# Patient Record
Sex: Male | Born: 1990 | Race: White | Hispanic: No | Marital: Married
Health system: Midwestern US, Academic
[De-identification: ages and names within clinical notes are randomized; demographics above are authoritative.]

---

## 2017-07-30 ENCOUNTER — Encounter: Admit: 2017-07-30 | Discharge: 2017-07-30 | Payer: BC Managed Care – PPO

## 2017-07-30 ENCOUNTER — Emergency Department: Admit: 2017-07-30 | Discharge: 2017-07-30 | Payer: BC Managed Care – PPO

## 2017-07-30 MED ORDER — FLUORESCEIN-PROPARACAINE OP DROP
1 [drp] | Freq: Once | OPHTHALMIC | 0 refills | Status: DC
Start: 2017-07-30 — End: 2017-07-31

## 2017-07-31 ENCOUNTER — Encounter: Admit: 2017-07-31 | Discharge: 2017-07-31 | Payer: BC Managed Care – PPO

## 2017-07-31 ENCOUNTER — Emergency Department: Admit: 2017-07-31 | Discharge: 2017-07-31 | Payer: BC Managed Care – PPO

## 2017-07-31 ENCOUNTER — Emergency Department
Admit: 2017-07-31 | Discharge: 2017-07-31 | Disposition: A | Discharge status: Home / Self Care | Payer: Worker's Compensation

## 2017-07-31 DIAGNOSIS — S0501XA Injury of conjunctiva and corneal abrasion without foreign body, right eye, initial encounter: Principal | ICD-10-CM

## 2017-07-31 MED ORDER — OXYCODONE 5 MG PO TAB
5 mg | Freq: Once | ORAL | 0 refills | Status: CP
Start: 2017-07-31 — End: ?
  Administered 2017-07-31: 08:00:00 5 mg via ORAL

## 2017-07-31 MED ORDER — FENTANYL CITRATE (PF) 50 MCG/ML IJ SOLN
50 ug | Freq: Once | INTRAVENOUS | 0 refills | Status: DC
Start: 2017-07-31 — End: 2017-07-31

## 2017-07-31 MED ORDER — CYCLOPENTOLATE 1 % OP DROP
1 [drp] | Freq: Three times a day (TID) | OPHTHALMIC | 0 refills | 31.00000 days | Status: AC
Start: 2017-07-31 — End: 2017-09-28

## 2017-07-31 MED ORDER — TOBRAMYCIN 0.3 % OP DROP
1 [drp] | Freq: Four times a day (QID) | OPHTHALMIC | 0 refills | 10.00000 days | Status: AC
Start: 2017-07-31 — End: 2017-09-28

## 2017-07-31 NOTE — ED Notes
Received Medical records from [facility]

## 2017-07-31 NOTE — ED Notes
Discharge instructions reviewed with patient and patient verbalized understanding.  Patient educated on corneal abrasions.  Patient leaving in agreement with plan of care.  Patient ambulated to exit with steady gait accompanied by family members x2.

## 2017-08-01 ENCOUNTER — Encounter: Admit: 2017-08-01 | Discharge: 2017-08-01 | Payer: BC Managed Care – PPO

## 2017-08-01 ENCOUNTER — Ambulatory Visit: Admit: 2017-08-01 | Discharge: 2017-08-02 | Payer: BC Managed Care – PPO

## 2017-08-01 DIAGNOSIS — I10 Essential (primary) hypertension: Principal | ICD-10-CM

## 2017-08-01 DIAGNOSIS — H18899 Other specified disorders of cornea, unspecified eye: Principal | ICD-10-CM

## 2017-08-01 NOTE — Assessment & Plan Note
Would expect corneal defect to be healed by now  Still with decrease in vision and pain  Will place BCL and start Vigamox QID along with ATs  F/U 2 days  Discussed importance of using drops and risk of infection  If no improvement in 2 days will consider Prokera

## 2017-08-01 NOTE — Progress Notes
Body mass index is 43.07 kg/m.              Assessment and Plan:    Problem   Persistent Corneal Epithelial Defect    hx of metal hitting OD with CTL in         Persistent corneal epithelial defect  Would expect corneal defect to be healed by now  Still with decrease in vision and pain  Will place BCL and start Vigamox QID along with ATs  F/U 2 days  Discussed importance of using drops and risk of infection  If no improvement in 2 days will consider Prokera    [facility] next appt    Donata Clay, MD  Staff Ophthalmologist

## 2017-08-03 ENCOUNTER — Ambulatory Visit: Admit: 2017-08-03 | Discharge: 2017-08-04 | Payer: Worker's Compensation

## 2017-08-03 ENCOUNTER — Encounter: Admit: 2017-08-03 | Discharge: 2017-08-03 | Payer: BC Managed Care – PPO

## 2017-08-03 DIAGNOSIS — H18899 Other specified disorders of cornea, unspecified eye: Principal | ICD-10-CM

## 2017-08-03 DIAGNOSIS — I10 Essential (primary) hypertension: Principal | ICD-10-CM

## 2017-08-03 NOTE — Progress Notes
There is no height or weight on file to calculate BMI.              Assessment and Plan:    Problem   Persistent Corneal Epithelial Defect    hx of metal hitting OD with CTL in         Persistent corneal epithelial defect  Removed BCL, epi defect appears improved and healing  Continue Vigamox QID and ATs QID  F/u in 1 week or sooner if worsening pain    Persistent corneal epithelial defect, healing  Defect now greatly improved, some irregular staining from re-epithelization  Pain and vision improving  Will d/c BCL and Vigamox, start erythromycin ointment TID with AT for comforts  Discussed importance of protecting eyes when at work, and using ointment   F/u in 1 week      Wonda Amis, MD  Ophthalmology Resident PGY-2  [phone]    ATTESTATION    I personally performed the key portions of the E/M visit, discussed case with resident and concur with resident documentation of history, physical exam, assessment, and treatment plan unless otherwise noted.    Staff name:  Gwendolyn Grant, MD Date:  08/03/2017        Next Visit:1 week  AUTO-Rx    MR    Atlas     Orbscan    LG  x   Schirmer     TOSM    IOP icare   Pachymetry    HVF    DILATE     OCT     M-Hdlebrg  N- Ziess    BAT    Gonio    IOL-Master        Donata Clay, MD  Staff Ophthalmologist

## 2017-08-10 ENCOUNTER — Encounter: Admit: 2017-08-10 | Discharge: 2017-08-10 | Payer: BC Managed Care – PPO

## 2017-08-10 DIAGNOSIS — H18899 Other specified disorders of cornea, unspecified eye: Principal | ICD-10-CM

## 2017-08-10 DIAGNOSIS — I10 Essential (primary) hypertension: Principal | ICD-10-CM

## 2017-08-10 NOTE — Progress Notes
Body mass index is 43.07 kg/m.              Assessment and Plan:    Problem   Persistent Corneal Epithelial Defect    hx of metal hitting OD with CTL in         Persistent corneal epithelial defect  Can stop Vigamox  Continue ATs QID-blurry vision likely from dryness  OCT mac normal  F/U with optometrist in [place] in 1 month  Can resume CTL next week if still comfortable  Discussed good CTL hygiene      Pauline Good, MD  Staff Ophthalmologist

## 2017-09-05 ENCOUNTER — Encounter: Admit: 2017-09-05 | Discharge: 2017-09-05 | Payer: BC Managed Care – PPO

## 2017-09-05 DIAGNOSIS — H18899 Other specified disorders of cornea, unspecified eye: Principal | ICD-10-CM

## 2017-09-05 DIAGNOSIS — I10 Essential (primary) hypertension: Principal | ICD-10-CM

## 2017-09-05 NOTE — Assessment & Plan Note
Vision continues to improve  Dryness improved but still present  Continue ATs BID-QID  Discussed good CTL hygiene and wearing safety glasses  F/U PRN

## 2017-09-05 NOTE — Progress Notes
Body mass index is 43.07 kg/m.              Assessment and Plan:    Problem   Persistent Corneal Epithelial Defect    hx of metal hitting OD with CTL in         Persistent corneal epithelial defect  Vision continues to improve  Dryness improved but still present  Continue ATs BID-QID  Discussed good CTL hygiene and wearing safety glasses  F/U PRN    Donata Clay, MD  Staff Ophthalmologist

## 2017-09-19 ENCOUNTER — Encounter: Admit: 2017-09-19 | Discharge: 2017-09-19 | Payer: BC Managed Care – PPO

## 2017-09-19 ENCOUNTER — Ambulatory Visit: Admit: 2017-09-19 | Discharge: 2017-09-20 | Payer: Worker's Compensation

## 2017-09-19 DIAGNOSIS — I10 Essential (primary) hypertension: Principal | ICD-10-CM

## 2017-09-19 DIAGNOSIS — H18899 Other specified disorders of cornea, unspecified eye: Principal | ICD-10-CM

## 2017-09-19 NOTE — Progress Notes
Body mass index is 44.39 kg/m.              Assessment and Plan:    Problem   Persistent Corneal Epithelial Defect    hx of metal hitting OD with CTL in         Persistent corneal epithelial defect  New epi defect centrally  Patient thinks maybe rubbed eye? Got dust in it?  BCL placed and started on Vigamox QID  Will f/u on Wed     Next Visit:wed  AUTO-Rx    MR    Atlas     Orbscan    LG  x   Schirmer     TOSM    IOP    Pachymetry    HVF    DILATE     OCT     M-Hdlebrg  N- Ziess    BAT    Gonio    IOL-Master        Donata Clay, MD  Staff Ophthalmologist

## 2017-09-19 NOTE — Assessment & Plan Note
New epi defect centrally  Patient thinks maybe rubbed eye? Got dust in it?  BCL placed and started on Vigamox QID  Will f/u on Wed

## 2017-09-21 ENCOUNTER — Ambulatory Visit: Admit: 2017-09-21 | Discharge: 2017-09-22 | Payer: Worker's Compensation

## 2017-09-21 ENCOUNTER — Encounter: Admit: 2017-09-21 | Discharge: 2017-09-21 | Payer: BC Managed Care – PPO

## 2017-09-21 DIAGNOSIS — I10 Essential (primary) hypertension: Principal | ICD-10-CM

## 2017-09-21 DIAGNOSIS — H18899 Other specified disorders of cornea, unspecified eye: Principal | ICD-10-CM

## 2017-09-21 MED ORDER — PREDNISOLONE ACETATE 1 % OP DRPS
1 [drp] | Freq: Four times a day (QID) | OPHTHALMIC | 1 refills | 5.00000 days | Status: AC
Start: 2017-09-21 — End: ?

## 2017-09-21 NOTE — Assessment & Plan Note
Epi defect resolved, now just PEE  Recommend continue Vigamox  QID and start PF QID  F/U 1 week  May need to stay out of contacts longer this time

## 2017-09-21 NOTE — Progress Notes
There is no height or weight on file to calculate BMI.              Assessment and Plan:    Problem   Persistent Corneal Epithelial Defect    hx of metal hitting OD with CTL in         Persistent corneal epithelial defect  Epi defect resolved, now just PEE  Recommend continue Vigamox  QID and start PF QID  F/U 1 week  May need to stay out of contacts longer this time       Next Visit:1 week  AUTO-Rx    MR    Atlas     Orbscan    LG  x   Schirmer     TOSM    IOP icare   Pachymetry    HVF    DILATE     OCT     M-Hdlebrg  N- Ziess    BAT    Gonio    IOL-Master        Donata Clay, MD  Staff Ophthalmologist

## 2017-09-28 ENCOUNTER — Encounter: Admit: 2017-09-28 | Discharge: 2017-09-28 | Payer: BC Managed Care – PPO

## 2017-09-28 DIAGNOSIS — H18899 Other specified disorders of cornea, unspecified eye: Principal | ICD-10-CM

## 2017-09-28 DIAGNOSIS — I10 Essential (primary) hypertension: Principal | ICD-10-CM

## 2017-09-28 NOTE — Progress Notes
There is no height or weight on file to calculate BMI.              Assessment and Plan:    Problem   Persistent Corneal Epithelial Defect    hx of metal hitting OD with CTL in         Persistent corneal epithelial defect  Now completely healed, no corneal edema  Discussed he may get recurrent epithelial defects so needs to keep eye lubricated  May consider adding Muro if gets another epithelial defect  Would like him to see optometry to make sure CTL is fitting properly  They will meet with an optometrist in [place]   Hold off on putting CTL until he is evaluated by optometrist    Donata Clay, MD  Staff Ophthalmologist

## 2017-09-28 NOTE — Assessment & Plan Note
Now completely healed, no corneal edema  Discussed he may get recurrent epithelial defects so needs to keep eye lubricated  May consider adding Muro if gets another epithelial defect  Would like him to see optometry to make sure CTL is fitting properly  They will meet with an optometrist in [place]   Hold off on putting CTL until he is evaluated by optometrist

## 2018-01-10 IMAGING — US DUPRENAL
1 series · 14 of 25 positions shown · non-contrast
Comparison: none

[Series 1: us duplex renal artery · 14 of 48 slices shown]
[im 1/48]
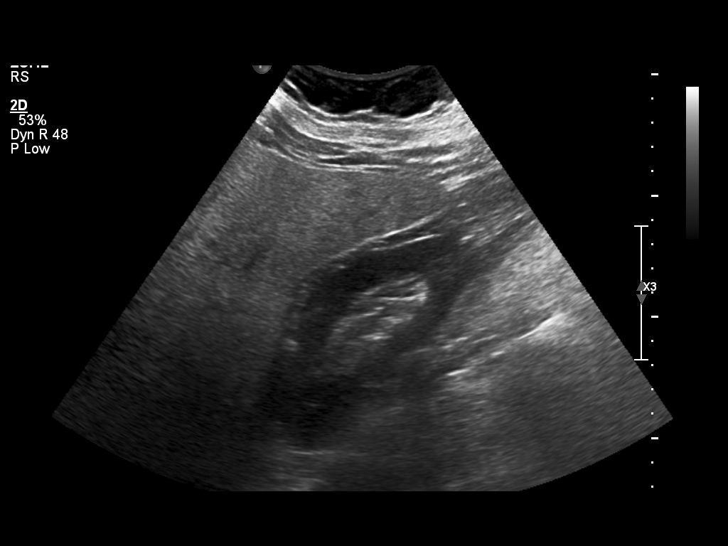
[im 4/48]
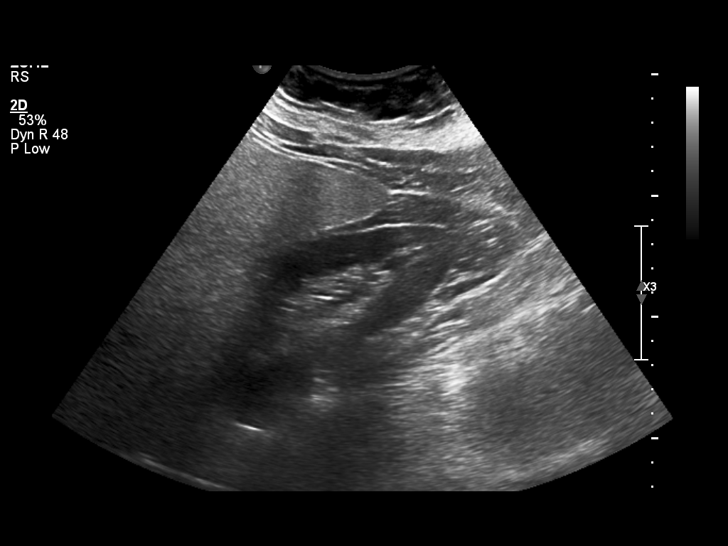
[im 8/48]
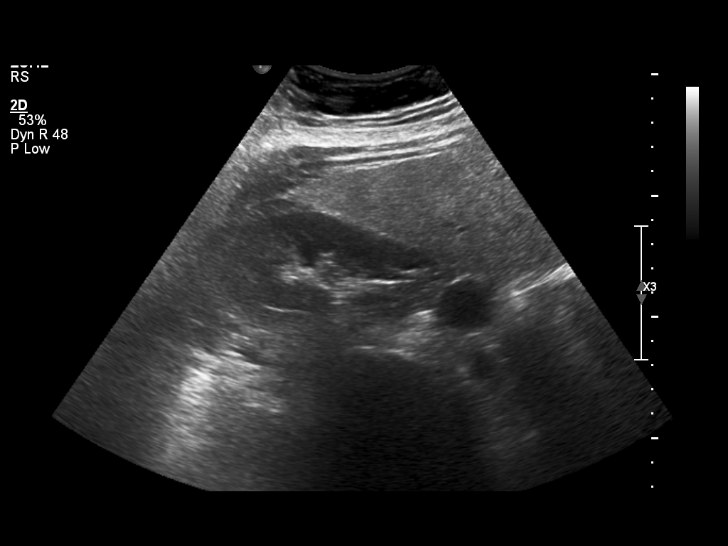
[im 12/48]
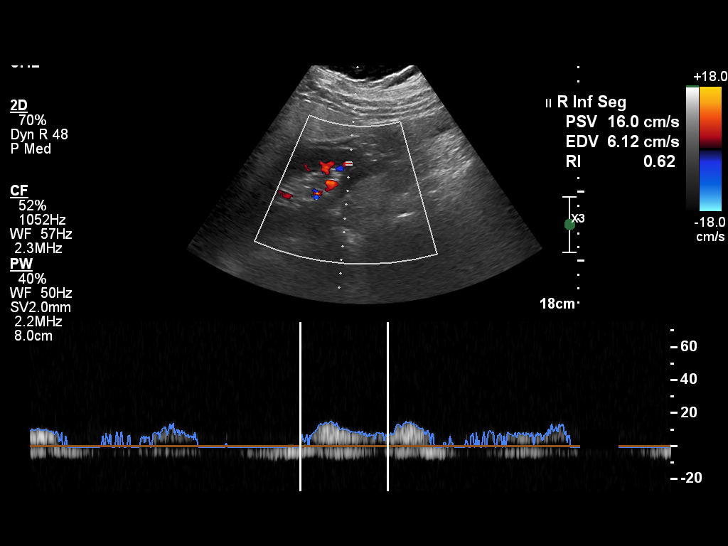
[im 16/48]
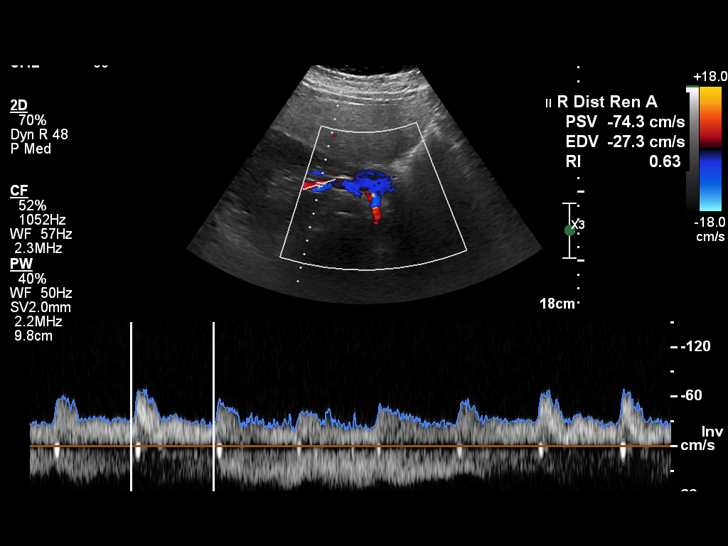
[im 18/48]
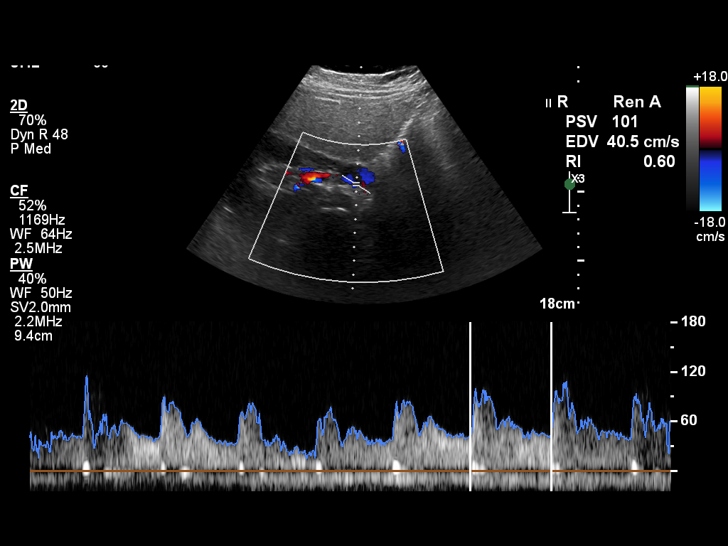
[im 22/48]
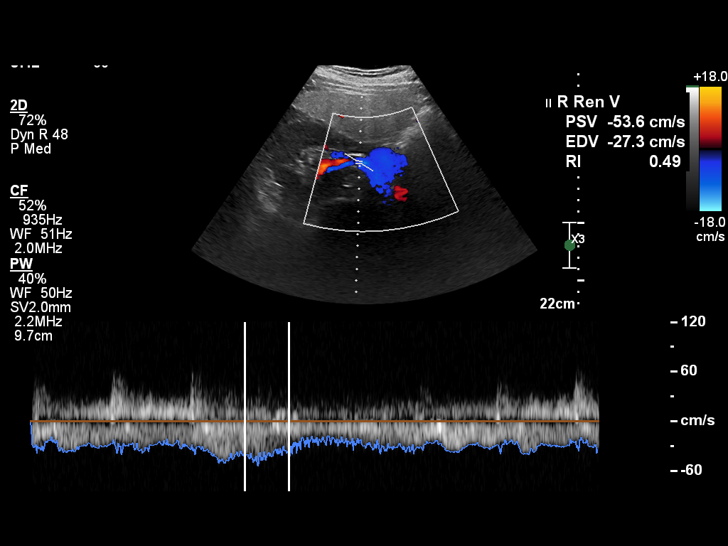
[im 26/48]
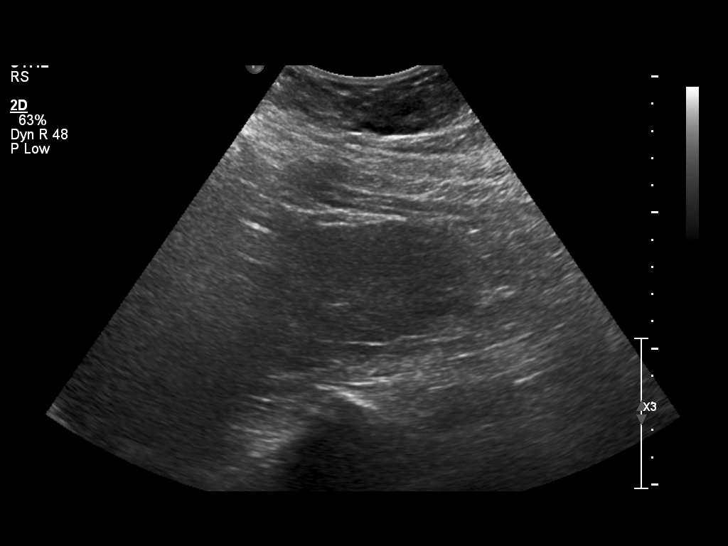
[im 30/48]
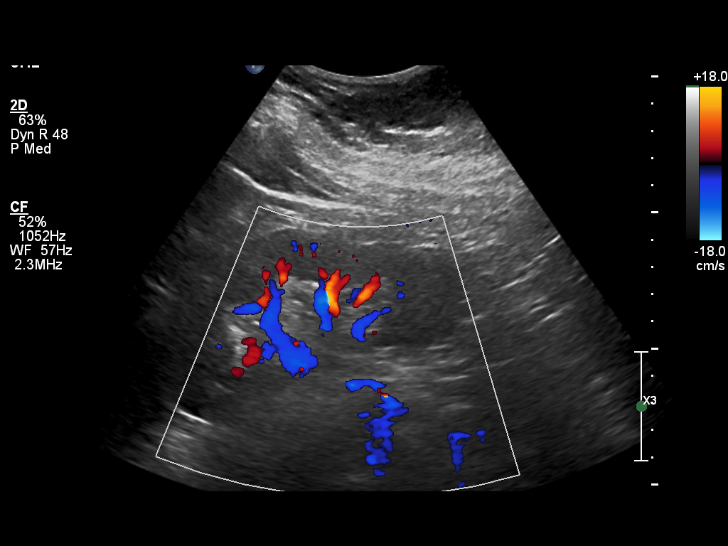
[im 32/48]
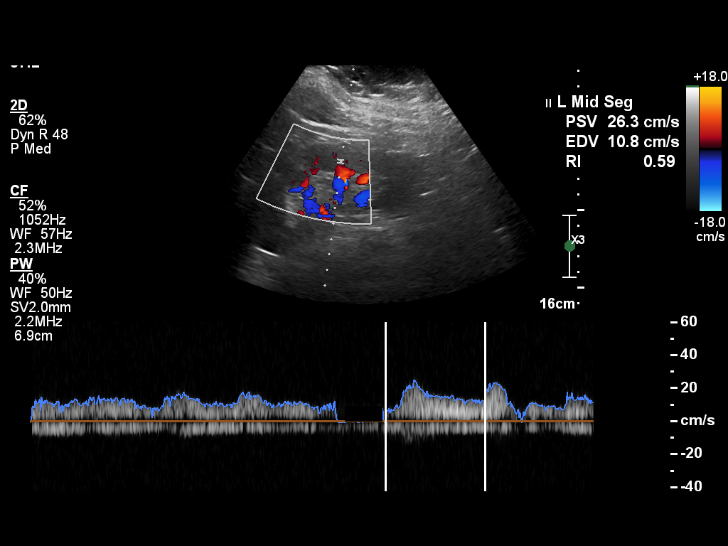
[im 36/48]
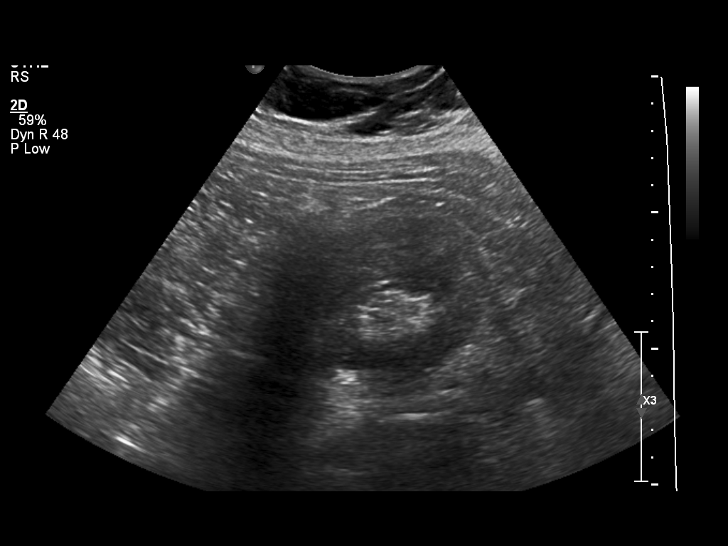
[im 40/48]
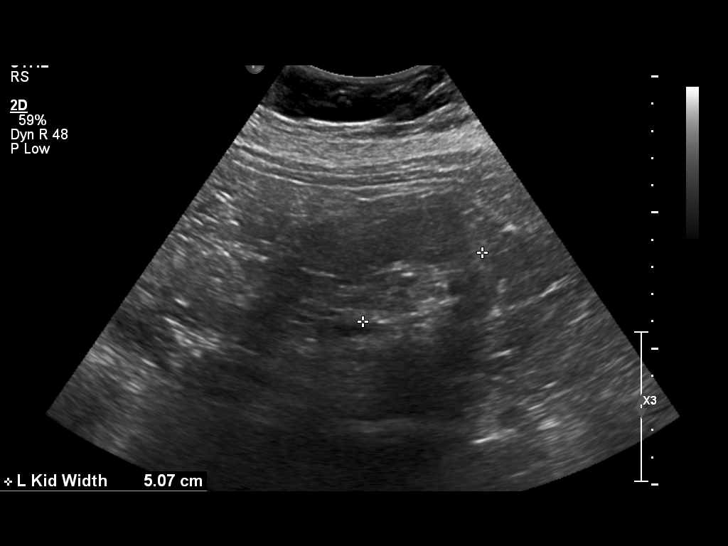
[im 44/48]
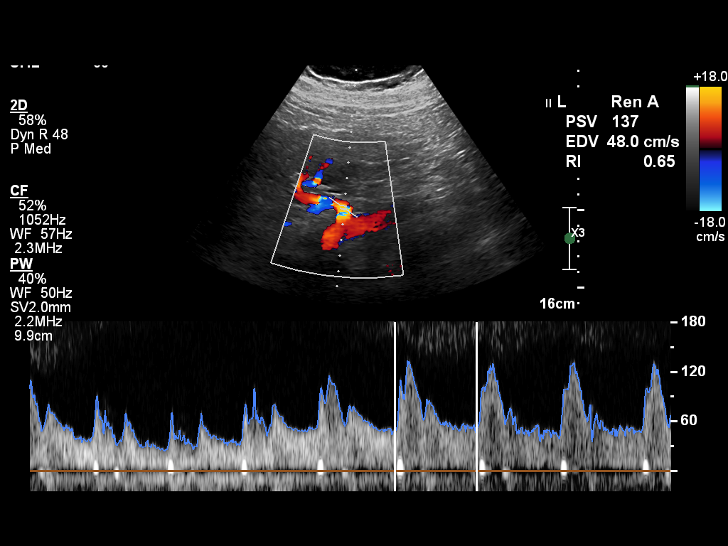
[im 48/48]
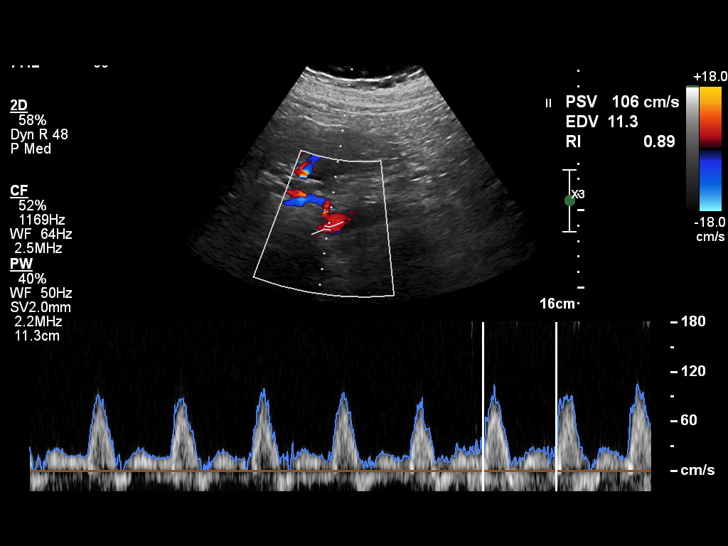

[14 of 25 positions shown; findings below may reference images not displayed]

ULTRASOUND REPORT

DIAGNOSTIC STUDIES

EXAM

Ultrasound duplex renal arteries

INDICATION

26-year-old male with hypertension

TECHNIQUE

Grayscale and color Doppler imaging of the kidneys and vasculature was performed with spectral
analysis.

COMPARISONS

None

FINDINGS

Right kidney 12.2 x 5.2 x 5.8 centimeter and left kidney 11.6 x 5.5 x 5.1 centimeter. Renal
cortical echogenicity and thickness are normal. No hydronephrosis or shadowing nephrolithiasis.

Right-sided renal aortic ratios and peak systolic velocities in cm/sec include right renal artery
origin 1.26 and 134, mid 0.86 and 91, and distal 0.69 and 74. Right renal vein is patent. No parvus
tardus waveforms.

Abdominal aortic peak systolic velocity 106 centimeter/second.

Left-sided renal aortic ratios and peak systolic velocities in cm/sec include left renal artery
origin 1.29 and 137, mid 1.00 and 107, and distal 0.99 and 105. Left renal vein is patent. No
parvus tardus waveforms.

IMPRESSION

No hemodynamically significant stenosis.
